# Patient Record
Sex: Male | Born: 1955 | Race: Black or African American | Hispanic: No | Marital: Married | Smoking: Never smoker
Health system: Southern US, Community
[De-identification: ages and names within clinical notes are randomized; demographics above are authoritative.]

## PROBLEM LIST (undated history)

## (undated) DIAGNOSIS — E7800 Pure hypercholesterolemia, unspecified: Secondary | ICD-10-CM

---

## 2017-04-22 ENCOUNTER — Encounter (HOSPITAL_COMMUNITY): Payer: Self-pay | Admitting: Emergency Medicine

## 2017-04-22 ENCOUNTER — Other Ambulatory Visit: Payer: Self-pay

## 2017-04-22 ENCOUNTER — Emergency Department (HOSPITAL_COMMUNITY)
Admission: EM | Admit: 2017-04-22 | Discharge: 2017-04-22 | Disposition: A | Discharge status: Home / Self Care | Payer: Self-pay | Attending: Emergency Medicine | Admitting: Emergency Medicine

## 2017-04-22 ENCOUNTER — Emergency Department (HOSPITAL_COMMUNITY): Payer: Self-pay

## 2017-04-22 DIAGNOSIS — Y939 Activity, unspecified: Secondary | ICD-10-CM | POA: Insufficient documentation

## 2017-04-22 DIAGNOSIS — W109XXA Fall (on) (from) unspecified stairs and steps, initial encounter: Secondary | ICD-10-CM | POA: Insufficient documentation

## 2017-04-22 DIAGNOSIS — Y998 Other external cause status: Secondary | ICD-10-CM | POA: Insufficient documentation

## 2017-04-22 DIAGNOSIS — S0990XA Unspecified injury of head, initial encounter: Secondary | ICD-10-CM

## 2017-04-22 DIAGNOSIS — Y92009 Unspecified place in unspecified non-institutional (private) residence as the place of occurrence of the external cause: Secondary | ICD-10-CM | POA: Insufficient documentation

## 2017-04-22 DIAGNOSIS — S0101XA Laceration without foreign body of scalp, initial encounter: Secondary | ICD-10-CM | POA: Insufficient documentation

## 2017-04-22 DIAGNOSIS — E7800 Pure hypercholesterolemia, unspecified: Secondary | ICD-10-CM | POA: Insufficient documentation

## 2017-04-22 HISTORY — DX: Pure hypercholesterolemia, unspecified: E78.00

## 2017-04-22 MED ORDER — ACETAMINOPHEN 500 MG PO TABS
1000.0000 mg | ORAL_TABLET | Freq: Once | ORAL | Status: AC
  Administered 2017-04-22: 1000 mg via ORAL
  Filled 2017-04-22: qty 2

## 2017-04-22 NOTE — ED Notes (Signed)
Pt was at family home when pt fell down some steps. Pt noted to have laceration to back of head. No LOC noted. Pt also has neck and back pain. Full ROM. No active distress.  Laceration not bleeding and covered with wrap. A/Ox4.

## 2017-04-22 NOTE — ED Triage Notes (Signed)
Pt states that he was staying the night at his daughters house and he woke up to go to the bathroom and fell down approximately 12 stairs, denies LOC, neck or back pain. Pt does have a approximately 4in laceration to the back of his head. Bleeding controlled at this time. Gauze dressing applied.

## 2017-04-22 NOTE — ED Provider Notes (Signed)
[facility]ENC[facility] Provider Note   CSN: [id number] Arrival date & time: 04/22/17  0158     History   Chief Complaint Chief Complaint  Patient presents with  . Fall  . Head Injury  . Laceration    HPI Maxwell L Kras Sr. is a 62 y.o. male.  HPI Patient is a 62 year old male who presents the emergency department with complaints of head injury and laceration to his posterior scalp.  He was at his daughter's house when he slipped and fell down her stairs.  He went down multiple stairs onto his back and striking the back of his head.  He presents with no active bleeding from the posterior scalp laceration.  Denies loss consciousness.  No neck or back pain.  Denies weakness of his arms or legs.  No chest pain or shortness of breath.  Denies abdominal pain.  No use of anticoagulants.  Symptoms are mild in severity.   Past Medical History:  Diagnosis Date  . High cholesterol     There are no active problems to display for this patient.   History reviewed. No pertinent surgical history.     Home Medications    Prior to Admission medications   Not on File    Family History No family history on file.  Social History Social History   Tobacco Use  . Smoking status: Never Smoker  . Smokeless tobacco: Never Used  Substance Use Topics  . Alcohol use: Yes    Comment: occassionally  . Drug use: No     Allergies   Patient has no known allergies.   Review of Systems Review of Systems  All other systems reviewed and are negative.    Physical Exam Updated Vital Signs BP (!) 138/53 (BP Location: Right Arm)   Pulse 83   Temp 98.3 F (36.8 C) (Oral)   Resp 18   Ht 5' 9.5" (1.765 m)   Wt 77.6 kg (171 lb)   SpO2 96%   BMI 24.89 kg/m   Physical Exam  Constitutional: He is oriented to person, place, and time. He appears well-developed and well-nourished.  HENT:  Head: Normocephalic.  Stellate nonbleeding laceration of his  posterior scalp.  Eyes: EOM are normal.  Neck: Normal range of motion. Neck supple.  No C-spine tenderness.  C-spine cleared by Nexus criteria.  Cardiovascular: Normal rate, regular rhythm, normal heart sounds and intact distal pulses.  Pulmonary/Chest: Effort normal and breath sounds normal. No respiratory distress.  Abdominal: Soft. He exhibits no distension. There is no tenderness.  Musculoskeletal: Normal range of motion.  Full range of motion of bilateral upper and lower extremity major joints.  Normal strength bilaterally.  No thoracic or lumbar point tenderness  Neurological: He is alert and oriented to person, place, and time.  Skin: Skin is warm and dry.  Psychiatric: He has a normal mood and affect. Judgment normal.  Nursing note and vitals reviewed.    ED Treatments / Results  Labs (all labs ordered are listed, but only abnormal results are displayed) Labs Reviewed - No data to display  EKG  EKG Interpretation None       Radiology Dg Lumbar Spine Complete  Result Date: 04/22/2017 CLINICAL DATA:  Pt said he got up around 0130 this morning to use the restroom, tripped at the top of the steps and fell down the stairs. Laceration to head. C/o LBP. EXAM: LUMBAR SPINE - COMPLETE 4+ VIEW COMPARISON:  None. FINDINGS: No fracture.  No spondylolisthesis.  No bone lesion. The disc spaces and facet joints are well preserved. There are minor endplate osteophytes most evident at L1-L2 and L4-L5. No other degenerative change. The soft tissues are unremarkable. IMPRESSION: 1. No fracture, spondylolisthesis or acute finding. 2. Minor disc degenerative changes.  No other abnormalities. Electronically Signed   By: Amie Portland M.D.   On: 04/22/2017 09:42   Ct Head Wo Contrast  Result Date: 04/22/2017 CLINICAL DATA:  62 year old who fell down stairs last night. Right-sided neck pain and tenderness. Occipital headache. Occipital laceration. Patient denies loss of consciousness. Initial  encounter. EXAM: CT HEAD WITHOUT CONTRAST CT CERVICAL SPINE WITHOUT CONTRAST TECHNIQUE: Multidetector CT imaging of the head and cervical spine was performed following the standard protocol without intravenous contrast. Multiplanar CT image reconstructions of the cervical spine were also generated. COMPARISON:  None. FINDINGS: CT HEAD FINDINGS Brain: Ventricular system normal in size and appearance for age. Physiologic calcifications in the basal ganglia. No mass lesion. No midline shift. No acute hemorrhage or hematoma. No extra-axial fluid collections. No evidence of acute infarction. Vascular: Moderate right carotid siphon atherosclerosis and minimal left carotid siphon atherosclerosis. Minimal right vertebral artery atherosclerosis. No hyperdense vessel. Skull: No skull fracture or other focal osseous abnormality involving the skull. Sinuses/Orbits: Mucosal thickening involving the maxillary sinuses, the bilateral ethmoid air cells and the right frontal sinus. Bilateral mastoid air cells and middle ear cavities well-aerated. Visualized orbits and globes normal in appearance. Other: Gas in the soft tissues of the midline occipital scalp associated with a small scalp hematoma at the site of the laceration. 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 CT CERVICAL SPINE FINDINGS Alignment: Anatomic alignment. Skull base and vertebrae: No fractures identified involving the cervical spine. Facet joints intact throughout. Coronal reformatted images demonstrate an intact craniocervical junction, intact dens and intact lateral masses throughout. Soft tissues and spinal canal: No evidence of paraspinous or spinal canal hematoma. No evidence of spinal stenosis. Disc levels: Central and left paracentral disc protrusion or extrusion at C3-4 with desiccation of the disc. Mild central disc protrusion at C5-6. Combination of facet and uncinate hypertrophy account for foraminal stenoses at multiple levels including: Mild left  C2-3, moderate right and severe left C3-4, mild left and moderate right C4-5, severe bilateral C5-6. Upper chest: Azygos fissure. Visualized lung apices clear. Atherosclerosis involving the aortic arch and the innominate artery. Other: None. IMPRESSION: CT head: 1. No acute intracranial abnormalities. 2. Laceration involving the occipital scalp with a small scalp hematoma. No underlying skull fracture. 3. Chronic sinus disease involving the maxillary sinuses, ethmoid sinuses and the right frontal sinus. 4. 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 CT cervical spine: 1. No fractures identified involving the cervical spine. 2. Central and left paracentral disc protrusion or extrusion at C3-4. Mild central disc protrusion at C5-6. 3. Multilevel foraminal stenoses due to facet and uncinate hypertrophy, detailed above. Electronically Signed   By: Hulan Saas M.D.   On: 04/22/2017 09:47   Ct Cervical Spine Wo Contrast  Result Date: 04/22/2017 CLINICAL DATA:  62 year old who fell down stairs last night. Right-sided neck pain and tenderness. Occipital headache. Occipital laceration. Patient denies loss of consciousness. Initial encounter. EXAM: CT HEAD WITHOUT CONTRAST CT CERVICAL SPINE WITHOUT CONTRAST TECHNIQUE: Multidetector CT imaging of the head and cervical spine was performed following the standard protocol without intravenous contrast. Multiplanar CT image reconstructions of the cervical spine were also generated. COMPARISON:  None. FINDINGS: CT HEAD FINDINGS Brain: Ventricular system normal in  size and appearance for age. Physiologic calcifications in the basal ganglia. No mass lesion. No midline shift. No acute hemorrhage or hematoma. No extra-axial fluid collections. No evidence of acute infarction. Vascular: Moderate right carotid siphon atherosclerosis and minimal left carotid siphon atherosclerosis. Minimal right vertebral artery atherosclerosis. No hyperdense vessel. Skull: No skull fracture or  other focal osseous abnormality involving the skull. Sinuses/Orbits: Mucosal thickening involving the maxillary sinuses, the bilateral ethmoid air cells and the right frontal sinus. Bilateral mastoid air cells and middle ear cavities well-aerated. Visualized orbits and globes normal in appearance. Other: Gas in the soft tissues of the midline occipital scalp associated with a small scalp hematoma at the site of the laceration. 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 CT CERVICAL SPINE FINDINGS Alignment: Anatomic alignment. Skull base and vertebrae: No fractures identified involving the cervical spine. Facet joints intact throughout. Coronal reformatted images demonstrate an intact craniocervical junction, intact dens and intact lateral masses throughout. Soft tissues and spinal canal: No evidence of paraspinous or spinal canal hematoma. No evidence of spinal stenosis. Disc levels: Central and left paracentral disc protrusion or extrusion at C3-4 with desiccation of the disc. Mild central disc protrusion at C5-6. Combination of facet and uncinate hypertrophy account for foraminal stenoses at multiple levels including: Mild left C2-3, moderate right and severe left C3-4, mild left and moderate right C4-5, severe bilateral C5-6. Upper chest: Azygos fissure. Visualized lung apices clear. Atherosclerosis involving the aortic arch and the innominate artery. Other: None. IMPRESSION: CT head: 1. No acute intracranial abnormalities. 2. Laceration involving the occipital scalp with a small scalp hematoma. No underlying skull fracture. 3. Chronic sinus disease involving the maxillary sinuses, ethmoid sinuses and the right frontal sinus. 4. 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 CT cervical spine: 1. No fractures identified involving the cervical spine. 2. Central and left paracentral disc protrusion or extrusion at C3-4. Mild central disc protrusion at C5-6. 3. Multilevel foraminal stenoses due to facet and  uncinate hypertrophy, detailed above. Electronically Signed   By: Hulan Saas M.D.   On: 04/22/2017 09:47    Procedures .Marland KitchenLaceration Repair Date/Time: 04/22/2017 11:39 AM Performed by: Azalia Bilis, MD Authorized by: Azalia Bilis, MD      LACERATION REPAIR Performed by: Azalia Bilis Consent: Verbal consent obtained. Risks and benefits: risks, benefits and alternatives were discussed Patient identity confirmed: provided demographic data Time out performed prior to procedure Prepped and Draped in normal sterile fashion Wound explored Laceration Location: Posterior scalp Laceration Length:  7cm No Foreign Bodies seen or palpated Anesthesia: None  irrigation method: syringe Amount of cleaning: standard Skin closure: Staple Number of sutures or staples: 5 staples Technique: staple Patient tolerance: Patient tolerated the procedure well with no immediate complications.   Medications Ordered in ED Medications  acetaminophen (TYLENOL) tablet 1,000 mg (1,000 mg Oral Given 04/22/17 1001)     Initial Impression / Assessment and Plan / ED Course  I have reviewed the triage vital signs and the nursing notes.  Pertinent labs & imaging results that were available during my care of the patient were reviewed by me and considered in my medical decision making (see chart for details).     C-spine cleared by Nexus criteria.  Laceration repaired with staples.  10-day removal of staples will be needed.  Head CT without acute pathology.  Full range of motion of major joints.  Ambulatory in the ER.  Discharged home in good condition.  Infection warnings given.  Head injury warnings given.  Patient and family understand return to the emergency department for new or worsening symptoms  Final Clinical Impressions(s) / ED Diagnoses   Final diagnoses:  Injury of head, initial encounter  Scalp laceration, initial encounter    ED Discharge Orders    None       Azalia Bilis,  MD 04/24/17 1615

## 2017-04-22 NOTE — ED Notes (Signed)
Patient transported to CT 

## 2017-04-22 NOTE — Discharge Instructions (Signed)
10 days for staple removal  Apply neosporin (generic ok) twice daily

## 2018-08-29 IMAGING — CT CT CERVICAL SPINE W/O CM
3 series · 12 of 33 positions shown, 14 images · non-contrast
Comparison: None.

CLINICAL DATA: 61-year-old who fell down stairs last night.
Right-sided neck pain and tenderness. Occipital headache. Occipital
laceration. Patient denies loss of consciousness. Initial encounter.

EXAM:
CT HEAD WITHOUT CONTRAST
CT CERVICAL SPINE WITHOUT CONTRAST
TECHNIQUE: Multidetector CT imaging of the head and cervical spine was
performed following the standard protocol without intravenous
contrast. Multiplanar CT image reconstructions of the cervical spine
were also generated.

[Series 3: head 5.0 h30s · axial · 0.48mm/px · z∈[-54,+61]mm · 4 of 33 slices shown, 5 images]
[im 5/33  soft-tissue]
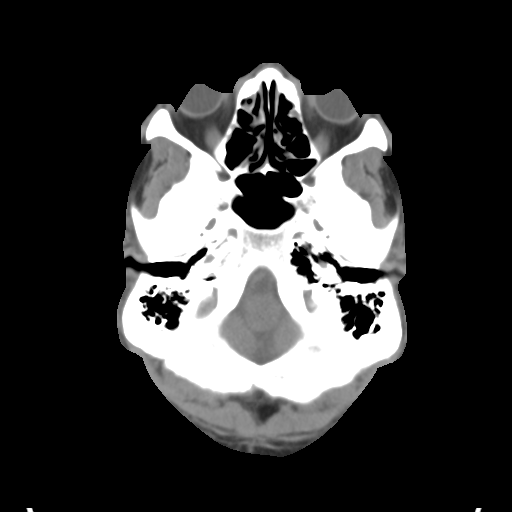
[im 5/33  bone]
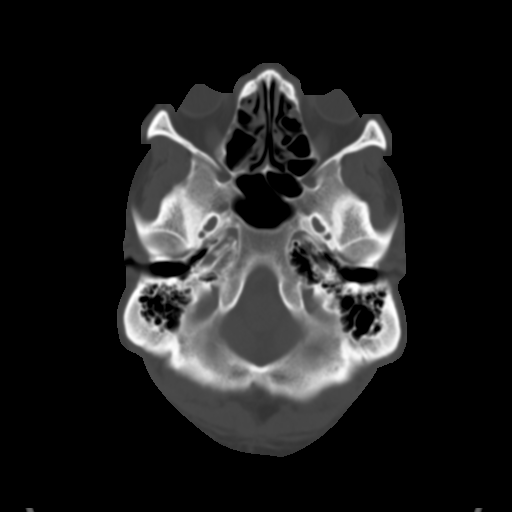
[im 13/33  bone]
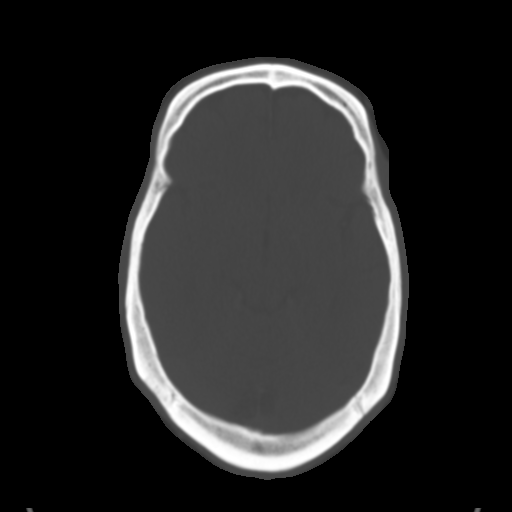
[im 20/33  bone]
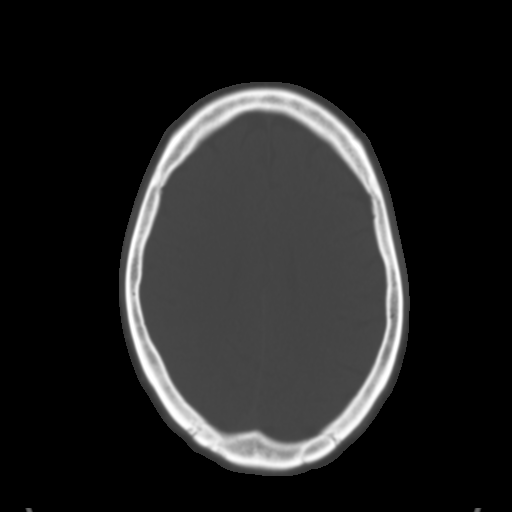
[im 28/33  bone]
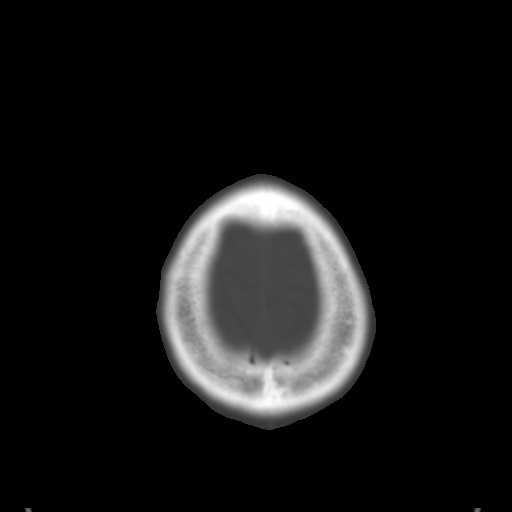

[Series 5: head 3.0 mpr cor · coronal · 0.34mm/px · 3 of 67 slices shown]
[im 14/67  bone]
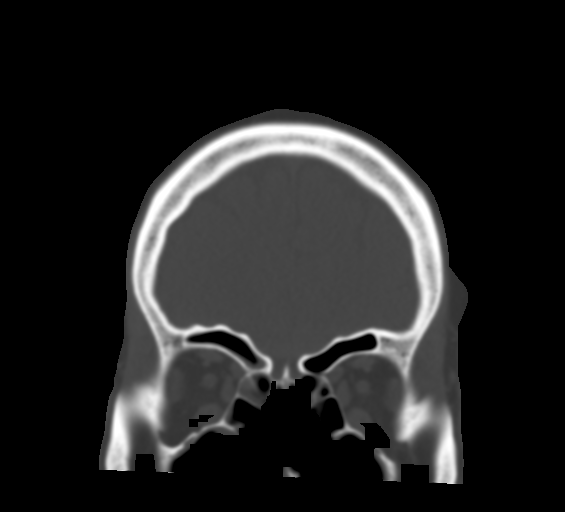
[im 27/67  bone]
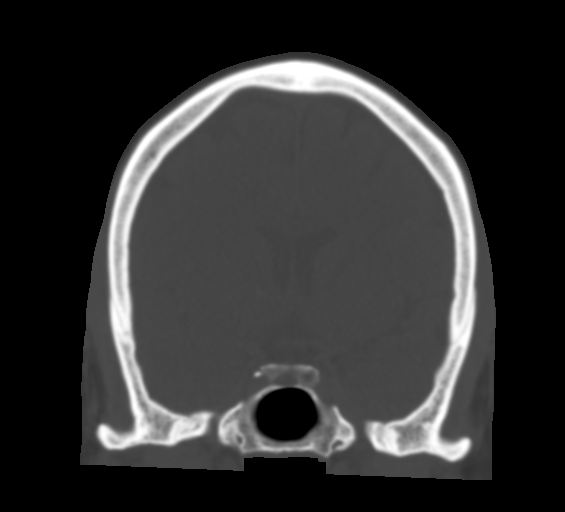
[im 40/67  bone]
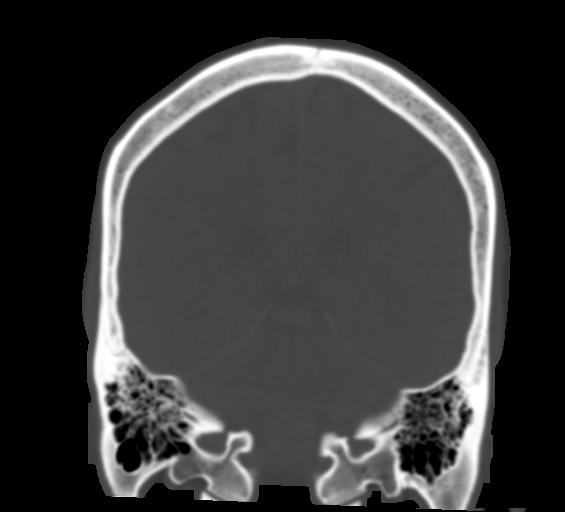

[Series 6: head 3.0 mpr sag · sagittal · 0.32mm/px · 5 of 67 slices shown, 6 images]
[im 23/67  bone]
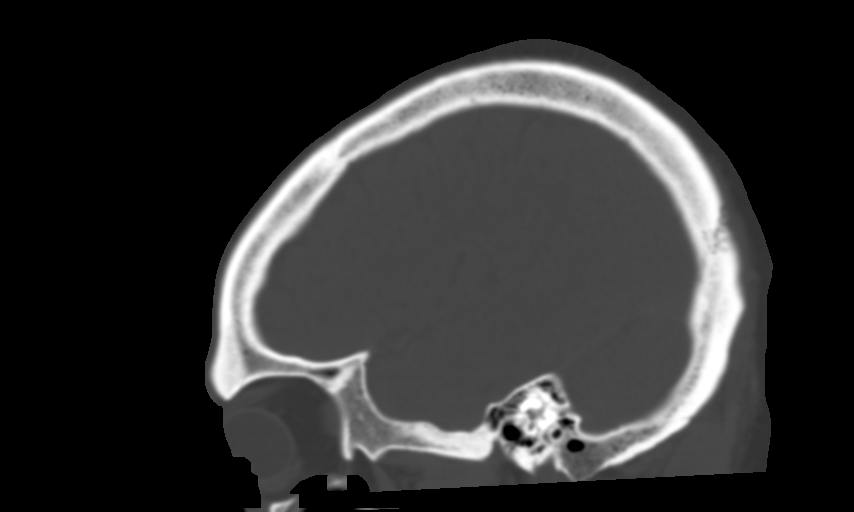
[im 28/67  bone]
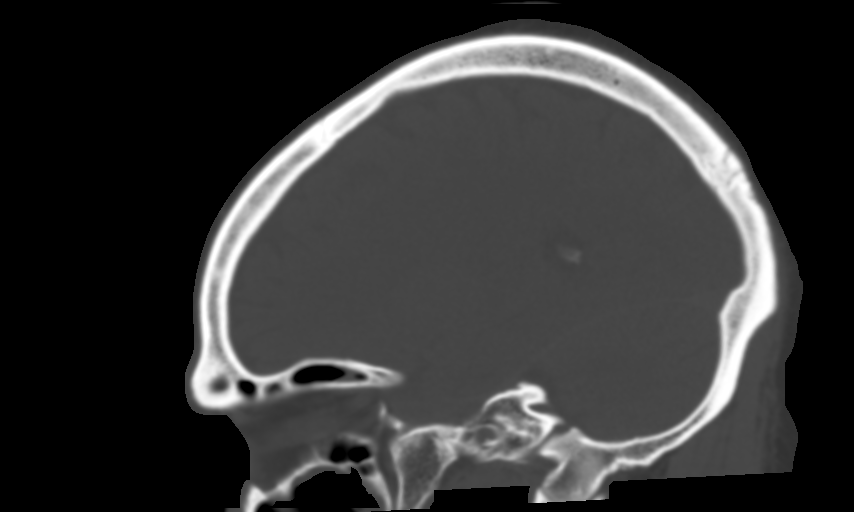
[im 34/67  soft-tissue]
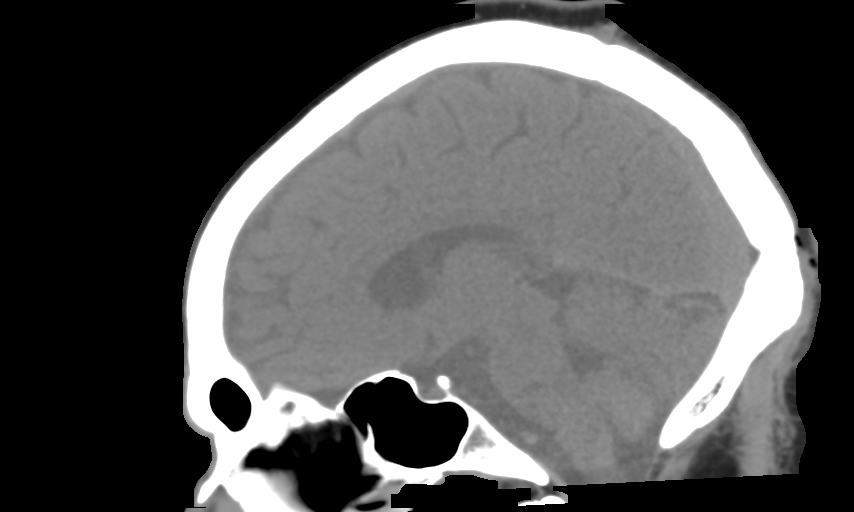
[im 34/67  bone]
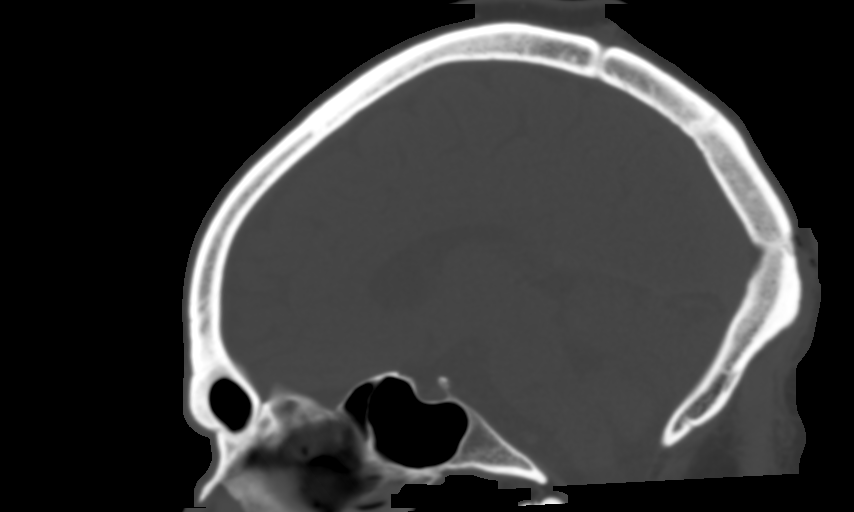
[im 39/67  bone]
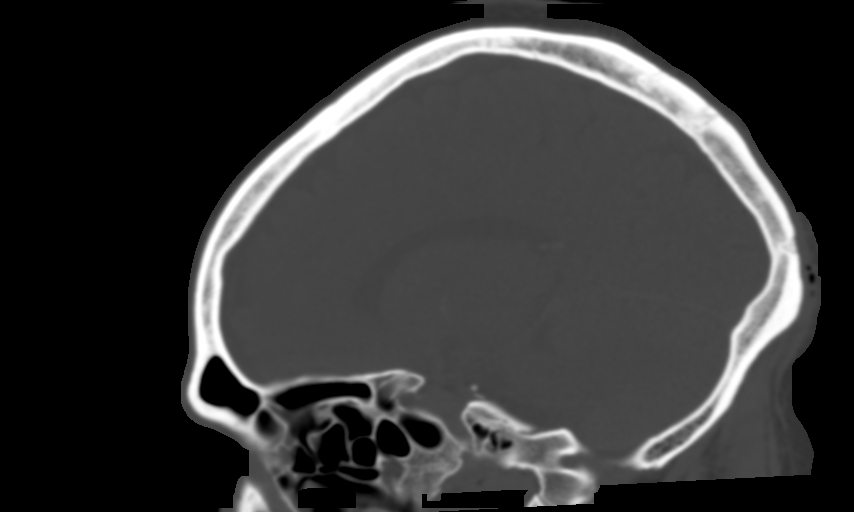
[im 45/67  bone]
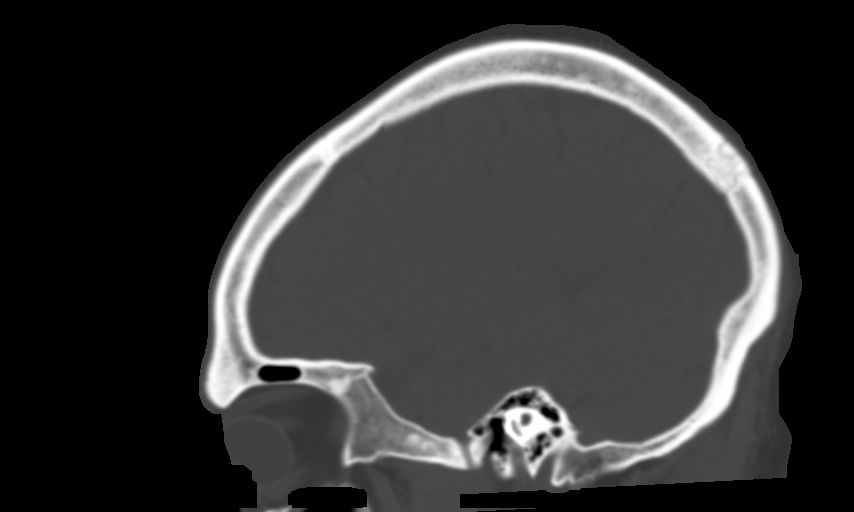

[12 of 33 positions shown; findings below may reference images not displayed]

FINDINGS: CT HEAD FINDINGS

Brain: Ventricular system normal in size and appearance for age.
Physiologic calcifications in the basal ganglia. No mass lesion. No
midline shift. No acute hemorrhage or hematoma. No extra-axial fluid
collections. No evidence of acute infarction.

Vascular: Moderate right carotid siphon atherosclerosis and minimal
left carotid siphon atherosclerosis. Minimal right vertebral artery
atherosclerosis. No hyperdense vessel.

Skull: No skull fracture or other focal osseous abnormality
involving the skull.

Sinuses/Orbits: Mucosal thickening involving the maxillary sinuses,
the bilateral ethmoid air cells and the right frontal sinus.
Bilateral mastoid air cells and middle ear cavities well-aerated.
Visualized orbits and globes normal in appearance.

Other: Gas in the soft tissues of the midline occipital scalp
associated with a small scalp hematoma at the site of the
laceration. Benign lipoma involving the left frontal scalp.

CT CERVICAL SPINE FINDINGS

Alignment: Anatomic alignment.

Skull base and vertebrae: No fractures identified involving the
cervical spine. Facet joints intact throughout. Coronal reformatted
images demonstrate an intact craniocervical junction, intact dens
and intact lateral masses throughout.

Soft tissues and spinal canal: No evidence of paraspinous or spinal
canal hematoma. No evidence of spinal stenosis.

Disc levels: Central and left paracentral disc protrusion or
extrusion at C3-4 with desiccation of the disc. Mild central disc
protrusion at C5-6. Combination of facet and uncinate hypertrophy
account for foraminal stenoses at multiple levels including: Mild
left C2-3, moderate right and severe left C3-4, mild left and
moderate right C4-5, severe bilateral C5-6.

Upper chest: Azygos fissure. Visualized lung apices clear.
Atherosclerosis involving the aortic arch and the innominate artery.

Other: None.
IMPRESSION: CT head:

1. No acute intracranial abnormalities.
2. Laceration involving the occipital scalp with a small scalp
hematoma. No underlying skull fracture.
3. Chronic sinus disease involving the maxillary sinuses, ethmoid
sinuses and the right frontal sinus.
4. Benign lipoma involving the left frontal scalp.

CT cervical spine:

1. No fractures identified involving the cervical spine.
2. Central and left paracentral disc protrusion or extrusion at
C3-4. Mild central disc protrusion at C5-6.
3. Multilevel foraminal stenoses due to facet and uncinate
hypertrophy, detailed above.

## 2022-06-07 DIAGNOSIS — R972 Elevated prostate specific antigen [PSA]: Secondary | ICD-10-CM

## 2022-06-08 ENCOUNTER — Encounter
Payer: PRIVATE HEALTH INSURANCE | Attending: Student in an Organized Health Care Education/Training Program | Primary: Family

## 2022-07-23 ENCOUNTER — Encounter: Admit: 2022-07-23 | Discharge: 2022-07-23 | Payer: PRIVATE HEALTH INSURANCE | Attending: Urology | Primary: Family

## 2022-07-23 DIAGNOSIS — R972 Elevated prostate specific antigen [PSA]: Secondary | ICD-10-CM

## 2022-07-23 NOTE — Progress Notes (Signed)
HISTORY OF PRESENT ILLNESS  Dustin L Avans Sr. is a 67 y.o. male.   has a past medical history of Asthma, High cholesterol, Hyperlipidemia, and Hypertension.  has no past surgical history on file.  Chief Complaint   Patient presents with    New Patient     Prostate exam     He is referred from Cline Crock, NP for an elevated PSA.      He says he does not have a problem urinating.  IPSS score 17/3 with 4 in incomplete emptying and frequency.      He has to sit to urinate.      PSA was 6.8 01/10/23 and 6.0 around March 2024.      He may have had a prostate biopsy or colonoscopy 10 years ago.  He says he has seen urology at the [facility].  He has a what he says is a prostate biopsy scheduled 07/27/22.  He describes prep with an enema and pills prior.      His father passed age 39 with prostate cancer.  He had radiation treatment and subsequent treatment for recurrence.          1. Elevated PSA  Overview:  PSA:  01/09/22; 6.8, free 26.6%  PSA; 6.0, free 29.3%  Assessment & Plan:  He has a risk of prostate cancer.  He says he has a prostate biopsy scheduled at the [facility] 07/27/22.  He is interested in continuing his care with me.  I will have him bring his results to me if he wants to me to continue his care.    2. Benign prostatic hyperplasia with nocturia  Assessment & Plan:  He has moderate difficulty.  He is not bothered enough to try medication.   ? 05/2022 for the PSA of 6.0.     No Known Allergies   Prior to Admission medications    Medication Sig Start Date End Date Taking? Authorizing Provider   atorvastatin (LIPITOR) 80 MG tablet Take 1 tablet by mouth daily   Yes [provider]   chlorthalidone (HYGROTON) 25 MG tablet Take 1 tablet by mouth daily   Yes [provider]   Albuterol Sulfate, sensor, (PROAIR DIGIHALER) 108 (90 Base) MCG/ACT AEPB Inhale into the lungs in the morning, at noon, in the evening, and at bedtime   Yes [provider]     Past Medical History:  PMHx (including  negatives):  has a past medical history of Asthma, High cholesterol, Hyperlipidemia, and Hypertension.   PSurgHx:  has no past surgical history on file.  PSocHx:  reports that he has never smoked. He does not have any smokeless tobacco history on file. He reports current alcohol use. He reports that he does not use drugs.   FamilyHX: No family history on file.    Review of Systems   All other systems reviewed and are negative.      Physical Exam  Constitutional:       General: He is not in acute distress.     Appearance: He is not ill-appearing.   HENT:      Head: Normocephalic and atraumatic.      Nose: Nose normal.      Mouth/Throat:      Mouth: Mucous membranes are moist.   Eyes:      Extraocular Movements: Extraocular movements intact.      Pupils: Pupils are equal, round, and reactive to light.   Cardiovascular:      Rate and  Rhythm: Normal rate and regular rhythm.   Pulmonary:      Effort: Pulmonary effort is normal. No respiratory distress.      Breath sounds: No wheezing, rhonchi or rales.   Abdominal:      General: There is no distension.      Palpations: There is no mass.      Tenderness: There is no abdominal tenderness. There is no right CVA tenderness, left CVA tenderness, guarding or rebound.      Hernia: A hernia (umbilical hernia) is present.   Genitourinary:     Penis: Normal. No tenderness, discharge or lesions.       Testes:         Right: Mass, tenderness or swelling not present.         Left: Mass, tenderness or swelling not present.      Epididymis:      Right: Not inflamed. No tenderness.      Left: Not inflamed. No tenderness.      Prostate: Enlarged (2+) and nodules present (right subsurface induration). Not tender.   Musculoskeletal:         General: No deformity.      Cervical back: Normal range of motion.      Right lower leg: No edema.      Left lower leg: No edema.   Skin:     General: Skin is warm and dry.      Findings: No lesion.   Neurological:      General: No focal deficit  present.      Mental Status: He is alert and oriented to person, place, and time.      Motor: No weakness.      Coordination: Coordination normal.      Gait: Gait normal.   Psychiatric:         Mood and Affect: Mood normal.             ASSESSMENT and PLAN  1. Elevated PSA  Assessment & Plan:  He has a risk of prostate cancer.  He says he has a prostate biopsy scheduled at the [facility] 07/27/22.  He is interested in continuing his care with me.  I will have him bring his results to me if he wants to me to continue his care.    2. Benign prostatic hyperplasia with nocturia  Assessment & Plan:  He has moderate difficulty.  He is not bothered enough to try medication.       Return in about 1 month (around 08/23/2022) for He should bring his results. Rozetta Nunnery, MD       Please note that portions of this note was potentially completed with Dragon dictation, the computer voice recognition software.  Quite often unanticipated grammatical, syntax, homophones, and other interpretive errors are inadvertently transcribed by the computer software.  Please disregard these errors.  Please excuse any errors that have escaped final proofreading.  Thank you.

## 2022-07-23 NOTE — Assessment & Plan Note (Signed)
He has a risk of prostate cancer.  He says he has a prostate biopsy scheduled at the [facility] 07/27/22.  He is interested in continuing his care with me.  I will have him bring his results to me if he wants to me to continue his care.

## 2022-07-23 NOTE — Assessment & Plan Note (Signed)
He has moderate difficulty.  He is not bothered enough to try medication.

## 2022-07-23 NOTE — Progress Notes (Signed)
Chief Complaint   Patient presents with    New Patient     Prostate exam     1. Have you been to the ER, urgent care clinic since your last visit?  Hospitalized since your last visit?No    2. Have you seen or consulted any other health care providers outside of the [facility] since your last visit?  Include any pap smears or colon screening. No  BP (!) 151/71   Pulse 75   Resp 16   Ht 1.765 m (5' 9.5")   Wt 83.9 kg (185 lb)   SpO2 97%   BMI 26.93 kg/m

## 2022-08-24 NOTE — Telephone Encounter (Signed)
I tried to call patient to schedule consultation with Dr. Milas Kocher. I left message to return my call.  [phone]

## 2022-09-10 ENCOUNTER — Encounter: Payer: PRIVATE HEALTH INSURANCE | Attending: Urology | Primary: Family

## 2022-09-13 ENCOUNTER — Ambulatory Visit: Admit: 2022-09-13 | Discharge: 2022-09-13 | Payer: PRIVATE HEALTH INSURANCE | Attending: Surgery | Primary: Family

## 2022-09-13 DIAGNOSIS — I739 Peripheral vascular disease, unspecified: Secondary | ICD-10-CM

## 2022-09-13 NOTE — Progress Notes (Unsigned)
Identified pt with two pt identifiers (name and DOB). Reviewed chart in preparation for visit and have obtained necessary documentation.    Dustin L Caratachea Sr. is a 67 y.o. male  Chief Complaint   Patient presents with    New Patient     Left Leg      BP 133/74 (Site: Left Upper Arm, Position: Sitting, Cuff Size: Large Adult)   Pulse 75   Temp 97.9 F (36.6 C) (Oral)   Resp 16   Ht 1.753 m (5\' 9" )   Wt 84.4 kg (186 lb)   SpO2 97%   BMI 27.47 kg/m

## 2022-09-20 DIAGNOSIS — I739 Peripheral vascular disease, unspecified: Secondary | ICD-10-CM

## 2022-09-20 NOTE — Progress Notes (Signed)
Vascular History and Physical    Patient: Dustin L Lerette Sr.  MRN: [id number]    Date of Birth: Mar 04, 1956  Age: 67 y.o.  Sex: male     Chief Complaint:  Chief Complaint   Patient presents with    New Patient     Left Leg        History of Present Illness: Dustin L Lotspeich Sr. is a 67 y.o. very pleasant gentleman is here today for vascular assessment.  Patient had recently vascular examination done both lower extremity.  This was done for screening.  Patient has no symptoms of leg pain.  Patient was told she has abnormal findings to see a vascular surgeon.  Patient denies any chest pain shortness of breath.  Patient denies any claudication symptoms.  Denies abdominal pain denies any chest pain or recent stroke symptoms.  Patient is otherwise pretty active.    Social History:  Social Connections: Not on file       Past Medical History:  Past Medical History:   Diagnosis Date    Asthma     High cholesterol     Hyperlipidemia     Hypertension        Surgical History:  No past surgical history on file.    Allergies:  No Known Allergies    Current Meds:  Current Outpatient Medications   Medication Sig Dispense Refill    atorvastatin (LIPITOR) 80 MG tablet Take 1 tablet by mouth daily      chlorthalidone (HYGROTON) 25 MG tablet Take 1 tablet by mouth daily      Albuterol Sulfate, sensor, (PROAIR DIGIHALER) 108 (90 Base) MCG/ACT AEPB Inhale into the lungs in the morning, at noon, in the evening, and at bedtime       No current facility-administered medications for this visit.       Review of Systems:  I reviewed the rest of organ systems personally and they are negative.  Pertinent findings discussed in HPI.    Physical Examination    BP 133/74 (Site: Left Upper Arm, Position: Sitting, Cuff Size: Large Adult)   Pulse 75   Temp 97.9 F (36.6 C) (Oral)   Resp 16   Ht 1.753 m (5\' 9" )   Wt 84.4 kg (186 lb)   SpO2 97%   BMI 27.47 kg/m   Gen: Well developed, well nourished 67 y.o. male in no acute distress  Head:  normocephalic, atraumatic  Mouth: Clear, no overt lesions, oral mucosa pink and moist  Neck: supple, no masses, no adenopathy or carotid bruits, trachea midline  Resp: clear to auscultation bilaterally, no wheeze, rhonchi or rales, excursions normal and symmetrical  Cardio: Regular rate and rhythm, no murmurs, clicks, gallops or rubs, no edema or varicosities  Abdomen: soft, nontender, nondistended, normoactive bowel sounds, no hernias, no hepatosplenomegaly   Neuro: sensation and strength grossly intact and symmetrical  Psych: alert and oriented to person, place and time  Vascular examination: Bilateral leg examination shows palpable pedal pulse noted bilateral DP and PT with triphasic signal on these locations.  Skin: warm and moist.    Labs:  No visits with results within 1 Month(s) from this visit.   Latest known visit with results is:   No results found for any previous visit.         Images:      Renee Rival, MD    Assessments:  Patient Active Problem List   Diagnosis    Elevated PSA    Benign  prostatic hyperplasia with nocturia    PAD (peripheral artery disease) (HCC)       Plans: Vascular assessment shows excellent vascular status with  triphasic signal both DP and PT bilaterally.  Patient was provided with reassurance.  However I did discuss with the patient about PAD as well.  Patient was encouraged to continue to be active and exercising.  Reminded again about cholesterol management, and adequate blood control as well.  If she is interested, I will have another vascular assessment in 1 year follow-up.      **Note: This memo is pertinent to patient with PAD.    Vascular risk factor modification regimen:  5 regimens include: Optimal cholesterol control, exercise program, medication modifications including antiplatelet therapy and antihypertensives, optimal blood pressure control, and optimal hydration.  6 regimens include: 5 regimens plus tobacco cessation.  7 regimens include: 5 regimens plus strict diabetic  control.  8 regimens include: 6 plus 7 regimens    Bookert Guzzi, MD

## 2022-09-24 ENCOUNTER — Encounter: Payer: PRIVATE HEALTH INSURANCE | Attending: Urology | Primary: Family

## 2022-10-05 ENCOUNTER — Encounter: Payer: PRIVATE HEALTH INSURANCE | Attending: Urology | Primary: Family

## 2022-10-05 DIAGNOSIS — R972 Elevated prostate specific antigen [PSA]: Secondary | ICD-10-CM

## 2022-10-15 ENCOUNTER — Encounter: Admit: 2022-10-15 | Discharge: 2022-10-15 | Payer: PRIVATE HEALTH INSURANCE | Attending: Urology | Primary: Family

## 2022-10-15 DIAGNOSIS — R972 Elevated prostate specific antigen [PSA]: Secondary | ICD-10-CM

## 2022-10-15 NOTE — Assessment & Plan Note (Addendum)
He had a prostate biopsy at the [facility] 07/27/22.  He says he was called and told it was benign.  He will plan on follow up at the [place].

## 2022-10-15 NOTE — Assessment & Plan Note (Signed)
Not bothered by urination.

## 2022-10-15 NOTE — Progress Notes (Signed)
HISTORY OF PRESENT ILLNESS  Dustin L Rambo Sr. is a 67 y.o. male.   has a past medical history of Asthma, High cholesterol, Hyperlipidemia, and Hypertension.  has no past surgical history on file.  Chief Complaint   Patient presents with    Follow-up    Elevated PSA     HPI    1. Elevated PSA  Overview:  12/2021: 6.8 (% free 26.6)  05/28/22: 6.0 (% free 29.3)  Assessment & Plan:  He had a prostate biopsy at the [facility] 07/27/22.  He says he was called and told it was benign.  He will plan on follow up at the [place].   2. Benign prostatic hyperplasia with nocturia  Overview:  Moderate symptoms but not bothered enough for medication/evaluation.  Assessment & Plan:  Not bothered by urination.       No Known Allergies   Prior to Admission medications    Medication Sig Start Date End Date Taking? Authorizing Provider   atorvastatin (LIPITOR) 80 MG tablet Take 1 tablet by mouth daily   Yes [provider]   chlorthalidone (HYGROTON) 25 MG tablet Take 1 tablet by mouth daily   Yes [provider]   Albuterol Sulfate, sensor, (PROAIR DIGIHALER) 108 (90 Base) MCG/ACT AEPB Inhale into the lungs in the morning, at noon, in the evening, and at bedtime   Yes [provider]     Past Medical History:  PMHx (including negatives):  has a past medical history of Asthma, High cholesterol, Hyperlipidemia, and Hypertension.   PSurgHx:  has no past surgical history on file.  PSocHx:  reports that he has never smoked. He does not have any smokeless tobacco history on file. He reports current alcohol use. He reports that he does not use drugs.   FamilyHX: No family history on file.    Review of Systems    Physical Exam        ASSESSMENT and PLAN  1. Elevated PSA  Assessment & Plan:  He had a prostate biopsy at the [facility] 07/27/22.  He says he was called and told it was benign.  He will plan on follow up at the [place].   2. Benign prostatic hyperplasia with nocturia  Assessment & Plan:  Not bothered by urination.       Return if  symptoms worsen or fail to improve.   Rozetta Nunnery, MD       Please note that portions of this note was potentially completed with Dragon dictation, the computer voice recognition software.  Quite often unanticipated grammatical, syntax, homophones, and other interpretive errors are inadvertently transcribed by the computer software.  Please disregard these errors.  Please excuse any errors that have escaped final proofreading.  Thank you.

## 2022-10-15 NOTE — Progress Notes (Signed)
Chief Complaint   Patient presents with    Follow-up    Elevated PSA     1. Have you been to the ER, urgent care clinic since your last visit?  Hospitalized since your last visit?No    2. Have you seen or consulted any other health care providers outside of the [facility] since your last visit?  Include any pap smears or colon screening. No  BP (!) 152/79   Pulse 90   Ht 1.753 m (5\' 9" )   Wt 84.4 kg (186 lb)   BMI 27.47 kg/m
